# Patient Record
Sex: Male | Born: 1990 | Race: White | Hispanic: No | Marital: Single | Smoking: Former smoker
Health system: Southern US, Community
[De-identification: ages and names within clinical notes are randomized; demographics above are authoritative.]

---
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
## 2012-01-10 HISTORY — PX: FRACTURE SURGERY: SHX138

## 2012-04-07 ENCOUNTER — Ambulatory Visit: Payer: Self-pay

## 2012-04-07 LAB — PROTIME-INR: Prothrombin Time: 23.4 secs — ABNORMAL HIGH (ref 11.5–14.7)

## 2012-04-09 LAB — PROTIME-INR: INR: 2

## 2012-04-11 ENCOUNTER — Ambulatory Visit: Payer: Self-pay

## 2012-04-11 LAB — PROTIME-INR: Prothrombin Time: 21.7 secs — ABNORMAL HIGH (ref 11.5–14.7)

## 2012-04-13 LAB — PROTIME-INR: INR: 2

## 2014-02-09 HISTORY — PX: FRACTURE SURGERY: SHX138

## 2014-04-21 ENCOUNTER — Encounter: Payer: Self-pay | Admitting: Unknown Physician Specialty

## 2014-05-12 ENCOUNTER — Encounter: Payer: Self-pay | Admitting: Unknown Physician Specialty

## 2014-09-15 ENCOUNTER — Ambulatory Visit: Payer: 59 | Attending: Unknown Physician Specialty | Admitting: Occupational Therapy

## 2014-09-15 ENCOUNTER — Encounter: Payer: Self-pay | Admitting: Occupational Therapy

## 2014-09-15 DIAGNOSIS — Z4789 Encounter for other orthopedic aftercare: Secondary | ICD-10-CM

## 2014-09-15 DIAGNOSIS — Z5189 Encounter for other specified aftercare: Secondary | ICD-10-CM | POA: Diagnosis not present

## 2014-09-15 NOTE — Patient Instructions (Signed)
Educated on donning and doffing as well as precautions - demo and verbalize understanding

## 2014-09-15 NOTE — Therapy (Signed)
[facility] [facility] [address], [place], [address] Phone: [phone]   Fax:  [phone]  Occupational Therapy Treatment  Patient Details  Name: Tanner French MRN: [id number] Date of Birth: 04/17/1990 Referring Provider:  Erin Sons, MD  Encounter Date: 09/15/2014      OT End of Session - 09/15/14 1754    Visit Number 1   Number of Visits 2   Date for OT Re-Evaluation 09/22/14   OT Start Time 1203   OT Stop Time 1226   OT Time Calculation (min) 23 min   Activity Tolerance Patient tolerated treatment well   Behavior During Therapy [facility] for tasks assessed/performed      No past medical history on file.  Past Surgical History  Procedure Laterality Date  . Fracture surgery Left 10/13    L Ulna  . Fracture surgery Left 11/15    L ulna    There were no vitals filed for this visit.  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 No/denies            [facility] OT Assessment - 09/15/14 0001    Assessment   Diagnosis Ulnar fracture   Onset Date 03/04/14   Prior Therapy Had splint earlier this year - but forgot it in car and melted    Precautions   Precaution Comments Protected forearm splint when out and about    AROM   Overall AROM Comments Wrist and hand AROM appear WFL                   OT Treatments/Exercises (OP) - 09/15/14 0001    Splinting   Splinting  Fabricated circular polycast fabric forearm splint to protect ulna - pt and mom educated on precautions , wearing and  donning /doffing                OT Education - 09/15/14 1754    Education provided Yes   Education Details splint wearing   Person(s) Educated Patient   Methods Explanation;Demonstration;Verbal cues   Comprehension Verbalized understanding;Returned demonstration;Verbal cues required          OT Short Term Goals - 09/15/14 1758    OT SHORT TERM GOAL #1   Title Pt and mom to be independent in donning and doffing of forearm splint    Time 2   Period Weeks   Status Achieved           OT Long Term Goals - 09/15/14 1758    OT LONG TERM GOAL #1   Title Pt and mom independent in wearing of forearm splint without any skin issues    Time 2   Period Weeks   Status New               Plan - 09/15/14 1755  Clinical Impression Statement Pt present about 7 months out from 2nd fracture to L ulna - was seen earlier this year for custome circular forearm splint for protection but forgot it in car and melted - fabricated this date new one from polycast - pt and mom show understanding for wearing of if and precautions - pt tto phone in any problems otherwise will  discharge him in week or 2   Pt will benefit from skilled therapeutic intervention in order to improve on the following deficits (Retired) Decreased knowledge of precautions;Decreased safety awareness;Other (comment)   Rehab Potential Fair   OT Frequency Biweekly   OT Duration 2 weeks   OT Treatment/Interventions Self-care/ADL training;Splinting   Consulted and Agree with Plan of Care Patient;Family member/caregiver        Problem List There are no active problems to display for this patient.   Deontez Klinke OTR/L, CLT 09/15/2014, 6:02 PM  [facility] [address] [place], [place], [address] Phone: [phone]   Fax:   [phone]

## 2015-10-01 ENCOUNTER — Ambulatory Visit: Payer: Managed Care, Other (non HMO) | Attending: Unknown Physician Specialty | Admitting: Occupational Therapy

## 2015-10-01 ENCOUNTER — Encounter: Payer: Self-pay | Admitting: Occupational Therapy

## 2015-10-01 DIAGNOSIS — M6281 Muscle weakness (generalized): Secondary | ICD-10-CM | POA: Insufficient documentation

## 2015-10-01 DIAGNOSIS — M25632 Stiffness of left wrist, not elsewhere classified: Secondary | ICD-10-CM | POA: Diagnosis present

## 2015-10-01 NOTE — Patient Instructions (Signed)
Pt to do heating pad with forearm in supination stretch 10 min  PROM for supination  Borrowed supination wheel for supination - 20 reps - elbow height table  2 lbs weight for sup - against wall to decrease leaning with body   Prayer stretch for wrist extention - 10reps  2 lbs weight for wrist extention - focus on midline  PUtty green for grip , pulling with all digits  10-12 reps

## 2015-10-01 NOTE — Therapy (Signed)
[place] [facility] [address] [place], [place], [address] Phone: [phone]   Fax:  [phone]  Occupational Therapy Treatment  Patient Details  Name: Tanner French Hampe MRN: [id number] Date of Birth: 12-03-1990 No Data Recorded  Encounter Date: 10/01/2015      OT End of Session - 10/01/15 1952    Visit Number 1   Number of Visits 6   Date for OT Re-Evaluation 11/12/15   OT Start Time 1435   OT Stop Time 1533   OT Time Calculation (min) 58 min   Activity Tolerance Patient tolerated treatment well;Treatment limited secondary to medical complications (Comment);Other (comment)   Behavior During Therapy [facility] for tasks assessed/performed;Impulsive      No past medical history on file.  Past Surgical History  Procedure Laterality Date  . Fracture surgery Left 10/13    L Ulna  . Fracture surgery Left 11/15    L ulna    There were no vitals filed for this visit.      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 No/denies            [facility] - 10/01/15 0001    Assessment   Diagnosis Ulnar fracture   Onset Date 03/04/14   Prior Therapy Had splnt made for shaft x 2    Home  Environment   Lives With Alone   Prior Function   Vocation Student   Leisure likes to play games, visit friends    Coordination   Other Pt was 50% slower with [facility] tasks and dexterity   AROM   Right Forearm Supination 90 Degrees   Left Forearm Supination 25 Degrees   Right Wrist Extension 68 Degrees   Right Wrist Flexion 92 Degrees   Left Wrist Extension 55 Degrees    Left Wrist Flexion 74 Degrees   Strength   Right Hand Grip (lbs) 102   Right Hand Lateral Pinch 21 lbs   Right Hand 3 Point Pinch 30 lbs   Left Hand Grip (lbs) 38   Left Hand Lateral Pinch 17 lbs   Left Hand 3 Point Pinch 13 lbs        see pt instruction                   OT Education - 10/01/15 1952    Education provided Yes   Education Details HEP   Person(s) Educated Patient;Parent(s)   Methods Demonstration;Tactile cues;Verbal cues;Handout;Explanation   Comprehension Verbalized understanding;Returned demonstration;Verbal cues required;Tactile cues required          OT Short Term Goals - 10/01/15 1959    OT SHORT TERM GOAL #1   Title Pt to show increase grip in L hand by 10 lbs at least to report more even pressure during massage    Baseline grip L 38, R 102 lbs    Time 3   Period Weeks   Status New   OT SHORT TERM GOAL #2   Title L extention improve with 5  degrees to reach in back pocket with more ease    Baseline Ext L 55, R 68 degrees   Time 3   Period Weeks   Status New           OT Long Term Goals - 10/01/15 2002    OT LONG TERM GOAL #1   Title Supination improve on L with 10-15 degrees to report more ease turn palm up in drive thru or if somebody hand him someting at school    Baseline sup L 25, R 90    Time 6   Period Weeks   Status New   OT LONG TERM GOAL #2   Title Pt to be ind in homeprogram to increaes ROM and strength in L UE - including shoulder -    Baseline Shoulder flexion and externtal rotation tight but functional                Plan - 10/01/15 1954    Clinical Impression Statement Pt present to  OT eval to increase  ROM , strength in L UE to increase functional use - 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, massage , turnning palm up in drive thru - reaching in back pocket - pt do have delayed healing of ulnar on L - pt on 5 lbs limit - pt in impulsive and  report he does pushups  - pt mom present and discuss that we maybe going to be limited in what we can gain back after so long -  did ed on HEP    Rehab Potential Fair   OT Frequency 1x / week   OT Duration 6 weeks   OT Treatment/Interventions Self-care/ADL training;Patient/family education;Therapeutic exercise;Parrafin;Passive range of motion   Plan assess progress of HEP    OT Home Exercise Plan see pt instruction    Consulted and Agree with Plan of Care Patient;Family member/caregiver      Patient will benefit from skilled therapeutic intervention in order to improve the following deficits and impairments:  Decreased range of motion, Impaired flexibility, Impaired UE functional use, Decreased strength  Visit Diagnosis: Stiffness of left wrist, not elsewhere classified - Plan: Ot plan of care cert/re-cert  Muscle weakness (generalized) - Plan: Ot plan of care cert/re-cert    Problem List There are no active problems to display for this patient.   [name], Adien Kimmel OTR/CLT 10/01/2015, 8:08 PM  [place] [facility] [address] [place], [place], [address] Phone: [phone]   Fax:  [phone]  Name: Tanner French Tanner French MRN: [id number] Date of Birth: 1990-12-07

## 2015-10-15 ENCOUNTER — Ambulatory Visit: Payer: Managed Care, Other (non HMO) | Attending: Unknown Physician Specialty | Admitting: Occupational Therapy

## 2015-10-15 DIAGNOSIS — M25632 Stiffness of left wrist, not elsewhere classified: Secondary | ICD-10-CM | POA: Diagnosis not present

## 2015-10-15 DIAGNOSIS — M6281 Muscle weakness (generalized): Secondary | ICD-10-CM | POA: Diagnosis present

## 2015-10-15 DIAGNOSIS — Z5189 Encounter for other specified aftercare: Secondary | ICD-10-CM | POA: Insufficient documentation

## 2015-10-15 NOTE — Patient Instructions (Signed)
Pt to do heating pad with forearm in supination stretch- elbow to side ( REINFORCE) 10 min  PROM for supination  Borrowed supination wheel for supination - 20 reps - elbow height table / sitting on cuttingboard    PUtty upgrade to dark blue for grip , pulling with all digits 10-12 reps

## 2015-10-15 NOTE — Therapy (Signed)
[place] [facility] [address] [place], [place], [address] Phone: [phone]   Fax:  [phone]  Occupational Therapy Treatment  Patient Details  Name: Tanner CocoBrandon C Irizarry MRN: [id number] Date of Birth: 1990-05-22 No Data Recorded  Encounter Date: 10/15/2015      OT End of Session - 10/15/15 1751    Visit Number 2   Number of Visits 6   Date for OT Re-Evaluation 11/12/15   OT Start Time 1558   OT Stop Time 1649   OT Time Calculation (min) 51 min   Activity Tolerance Patient tolerated treatment well;Treatment limited secondary to medical complications (Comment);Other (comment)   Behavior During Therapy [facility] for tasks assessed/performed;Impulsive      No past medical history on file.  Past Surgical History  Procedure Laterality Date  . Fracture surgery Left 10/13    L Ulna  . Fracture surgery Left 11/15    L ulna    There were no vitals filed for this visit.      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 No/denies    Measurements takend and progress and HEP discuss with pt and mom   Paraffin to L hand and wrist with hand and forearm in supination with  heating pad with forearm in supination stretch 10 min  PROM for supination   supination wheel for supination - 20 reps - elbow height table - with AAROM  Discuss position of elbow very importance and where to do at home   Prayer stretch for wrist extention - 10reps  2 lbs weight for wrist extention - focus on midline  PUtty upgrade after  green for grip to dark blue - 2 x 15 reps Also review  pulling with all digits- pt was doing only 3 point pull  10-12 reps                           OT Education - 10/15/15 1750    Education provided Yes   Education Details HEP   Person(s) Educated Patient;Parent(s)   Methods Explanation;Demonstration;Tactile cues;Verbal cues;Handout   Comprehension Verbal cues required;Returned demonstration;Verbalized understanding          OT Short Term Goals - 10/01/15 1959    OT SHORT TERM GOAL #1   Title Pt to show increase grip in L hand by 10 lbs at least to report more even pressure during massage    Baseline grip L 38, R 102 lbs    Time 3   Period Weeks   Status New   OT SHORT TERM GOAL #2   Title L extention improve with 5 degrees to reach in back pocket with more ease    Baseline Ext L 55, R 68 degrees   Time 3   Period Weeks   Status New           OT Long Term Goals - 10/01/15 2002    OT LONG TERM GOAL #  1   Title Supination improve on L with 10-15 degrees to report more ease turn palm up in drive thru or if somebody hand him someting at school    Baseline sup L 25, R 90    Time 6   Period Weeks   Status New   OT LONG TERM GOAL #2   Title Pt to be ind in homeprogram to increaes ROM and strength in L UE - including shoulder -    Baseline Shoulder flexion and externtal rotation tight but functional                Plan - 10/15/15 1752    Clinical Impression Statement Pt admit did not do all the exercises - and also when did the supinaton stretch did it with elbow straight and shoulder height - explain again for pt that is out of shoulder and not forearm - pt also did not do putty gripping , more pulliing but not all dits - did reviewed again HEP with pt and mom - pt to see DR kernocle next week - will return in 2 wks  - did upgrade to harder putty    Rehab Potential Fair   OT Frequency Biweekly   OT Duration 4 weeks   OT Treatment/Interventions Self-care/ADL training;Patient/family  education;Therapeutic exercise;Parrafin;Passive range of motion   Plan assess progress with HEP   OT Home Exercise Plan see pt instruction    Consulted and Agree with Plan of Care Patient;Family member/caregiver      Patient will benefit from skilled therapeutic intervention in order to improve the following deficits and impairments:  Decreased range of motion, Impaired flexibility, Impaired UE functional use, Decreased strength  Visit Diagnosis: Stiffness of left wrist, not elsewhere classified  Muscle weakness (generalized)    Problem List There are no active problems to display for this patient.   [name], Keneisha Heckart OTR/L,CLT  10/15/2015, 5:54 PM  [place] [facility] [address] [place], [place], [address] Phone: [phone]   Fax:  [phone]  Name: Tanner CocoBrandon C French MRN: [id number] Date of Birth: 1991/04/06

## 2015-10-29 ENCOUNTER — Ambulatory Visit: Payer: Managed Care, Other (non HMO) | Admitting: Occupational Therapy

## 2015-10-29 DIAGNOSIS — M25632 Stiffness of left wrist, not elsewhere classified: Secondary | ICD-10-CM | POA: Diagnosis not present

## 2015-10-29 DIAGNOSIS — Z4789 Encounter for other orthopedic aftercare: Secondary | ICD-10-CM

## 2015-10-29 DIAGNOSIS — M6281 Muscle weakness (generalized): Secondary | ICD-10-CM

## 2015-10-29 NOTE — Patient Instructions (Signed)
Pt to cont with same HEP - awaiting response back from [place] surgeon in [place] - about weight bearing and strenghtening as well as what to expect with xray  And  Distal ulnar instability Mom send email

## 2015-10-29 NOTE — Therapy (Signed)
[facility] [place] [facility] [address] [place], [place], [address] Phone: [phone]   Fax:  [phone]  Occupational Therapy Treatment  Patient Details  Name: Tanner CocoBrandon C Kiely MRN: [id number] Date of Birth: 08/02/1990 No Data Recorded  Encounter Date: 10/29/2015      OT End of Session - 10/29/15 2000    Visit Number 3   Number of Visits 6   Date for OT Re-Evaluation 11/12/15   OT Start Time 1300   OT Stop Time 1346   OT Time Calculation (min) 46 min   Activity Tolerance Patient tolerated treatment well;Treatment limited secondary to medical complications (Comment);Other (comment)   Behavior During Therapy [facility] for tasks assessed/performed;Impulsive      No past medical history on file.  Past Surgical History  Procedure Laterality Date  . Fracture surgery Left 10/13    L Ulna  . Fracture surgery Left 11/15    L ulna    There were no vitals filed for this visit.      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 No/denies            [facility] Assessment - 10/29/15 0001    Strength   Left Hand Grip (lbs) 40   Left Hand Lateral Pinch 17 lbs       Supination same  Pain with supination stretch with elbow to side at home  This date with pressure on ulna head during supination - clicking   Assess xray that mom had  DRUJ appear head of ulna instability  ?? If supination and grip will improve   2 shaft fractures in past - infection - atrophy  In length discuss with pt and mom - about  strenghteing and working out in gym - pt want to start with 25 lbs and more for lifting  But pt only was allow more than 7 lbs by MD week or 2 ago  Mom has email in to surgeon in [place] - for opinion - will await results                    OT Education - 10/29/15 1959    Education provided Yes   Education Details Finding of assessment and looking at xray - and what pt report - discuss plan with pt and mom    Person(s) Educated Patient   Methods Explanation;Demonstration;Tactile cues;Verbal cues   Comprehension Verbal cues required;Returned demonstration;Verbalized understanding          OT Short Term Goals - 10/01/15 1959    OT SHORT TERM GOAL #1   Title Pt to show increase grip in L hand by 10 lbs at least to report more even pressure during massage    Baseline grip L 38, R 102 lbs    Time 3   Period Weeks   Status New   OT SHORT TERM GOAL #2   Title L  extention improve with 5 degrees to reach in back pocket with more ease    Baseline Ext L 55, R 68 degrees   Time 3   Period Weeks   Status New           OT Long Term Goals - 10/01/15 2002    OT LONG TERM GOAL #1   Title Supination improve on L with 10-15 degrees to report more ease turn palm up in drive thru or if somebody hand him someting at school    Baseline sup L 25, R 90    Time 6   Period Weeks   Status New   OT LONG TERM GOAL #2   Title Pt to be ind in homeprogram to increaes ROM and strength in L UE - including shoulder -    Baseline Shoulder flexion and externtal rotation tight but functional                Plan - 10/29/15 2001    Clinical Impression Statement Pt did not show any progress in supination since eval , grip improved with 2 lbs - pt compensate with ext rotation of shoulder for supination stretch - pt report this date if doing correctly it hurts about 4/10 - and some clicking -  when mom  showed xray - it appear that  there is displace ment or instability  at DRUJ -   question  if supiantion  and grip still will improve with history of that and infection in past , 2 shaft fractures nad athropy- mom Chiropractor in [place] - attempted to call Dr Gavin Potters this am  - pt no in office     Rehab Potential Fair   OT Treatment/Interventions Self-care/ADL training;Patient/family education;Therapeutic exercise;Parrafin;Passive range of motion   Plan await  response back from ortho    OT Home Exercise Plan see pt instruction    Consulted and Agree with Plan of Care Patient;Family member/caregiver      Patient will benefit from skilled therapeutic intervention in order to improve the following deficits and impairments:  Decreased range of motion, Impaired flexibility, Impaired UE functional use, Decreased strength  Visit Diagnosis: Stiffness of left wrist, not elsewhere classified  Muscle weakness (generalized)  Encounter for training in use of orthotic device    Problem List There are no active problems to display for this patient.   Oletta Cohn OTR/L,CLT  10/29/2015, 8:05 PM  [place] [facility] PHYSICAL AND SPORTS MEDICINE [address], [place], [address] Phone: [phone]   Fax:  [phone]  Name: BYARD CARRANZA MRN: [id number] Date of Birth: June 26, 1990

## 2016-11-18 ENCOUNTER — Other Ambulatory Visit: Payer: Self-pay | Admitting: Specialist

## 2016-11-18 DIAGNOSIS — M8660 Other chronic osteomyelitis, unspecified site: Secondary | ICD-10-CM

## 2016-11-19 ENCOUNTER — Ambulatory Visit: Payer: Managed Care, Other (non HMO)

## 2016-12-01 ENCOUNTER — Ambulatory Visit
Admission: RE | Admit: 2016-12-01 | Discharge: 2016-12-01 | Disposition: A | Discharge status: Home / Self Care | Payer: Managed Care, Other (non HMO) | Source: Ambulatory Visit | Attending: Specialist | Admitting: Specialist

## 2016-12-01 ENCOUNTER — Ambulatory Visit
Admission: RE | Admit: 2016-12-01 | Discharge: 2016-12-01 | Disposition: A | Discharge status: Home / Self Care | Payer: Managed Care, Other (non HMO) | Source: Ambulatory Visit | Attending: *Deleted | Admitting: *Deleted

## 2016-12-01 ENCOUNTER — Other Ambulatory Visit: Payer: Self-pay | Admitting: Specialist

## 2016-12-01 DIAGNOSIS — M8660 Other chronic osteomyelitis, unspecified site: Secondary | ICD-10-CM | POA: Diagnosis not present

## 2016-12-01 DIAGNOSIS — Z8781 Personal history of (healed) traumatic fracture: Secondary | ICD-10-CM | POA: Insufficient documentation

## 2017-01-05 ENCOUNTER — Other Ambulatory Visit: Payer: Self-pay | Admitting: Specialist

## 2017-01-05 DIAGNOSIS — M79631 Pain in right forearm: Secondary | ICD-10-CM

## 2017-01-05 DIAGNOSIS — M79602 Pain in left arm: Secondary | ICD-10-CM

## 2017-01-05 DIAGNOSIS — M79601 Pain in right arm: Secondary | ICD-10-CM

## 2017-01-05 DIAGNOSIS — M79632 Pain in left forearm: Secondary | ICD-10-CM

## 2017-01-06 ENCOUNTER — Other Ambulatory Visit: Payer: Self-pay | Admitting: Specialist

## 2017-01-06 DIAGNOSIS — M86639 Other chronic osteomyelitis, unspecified radius and ulna: Secondary | ICD-10-CM

## 2017-01-06 DIAGNOSIS — M86632 Other chronic osteomyelitis, left radius and ulna: Secondary | ICD-10-CM

## 2017-01-12 ENCOUNTER — Ambulatory Visit
Admission: RE | Admit: 2017-01-12 | Discharge: 2017-01-12 | Disposition: A | Discharge status: Home / Self Care | Payer: Managed Care, Other (non HMO) | Source: Ambulatory Visit | Attending: Specialist | Admitting: Specialist

## 2017-01-12 DIAGNOSIS — M86639 Other chronic osteomyelitis, unspecified radius and ulna: Secondary | ICD-10-CM | POA: Insufficient documentation

## 2017-01-12 DIAGNOSIS — Z01818 Encounter for other preprocedural examination: Secondary | ICD-10-CM | POA: Insufficient documentation

## 2017-01-12 DIAGNOSIS — M86632 Other chronic osteomyelitis, left radius and ulna: Secondary | ICD-10-CM | POA: Insufficient documentation

## 2017-01-12 DIAGNOSIS — M2480 Other specific joint derangements of unspecified joint, not elsewhere classified: Secondary | ICD-10-CM | POA: Diagnosis not present

## 2017-03-24 ENCOUNTER — Ambulatory Visit: Payer: Managed Care, Other (non HMO) | Attending: Specialist | Admitting: Occupational Therapy

## 2017-03-24 DIAGNOSIS — M6281 Muscle weakness (generalized): Secondary | ICD-10-CM | POA: Insufficient documentation

## 2017-03-24 DIAGNOSIS — Z4789 Encounter for other orthopedic aftercare: Secondary | ICD-10-CM | POA: Diagnosis present

## 2017-03-24 DIAGNOSIS — L905 Scar conditions and fibrosis of skin: Secondary | ICD-10-CM | POA: Insufficient documentation

## 2017-03-24 DIAGNOSIS — M25632 Stiffness of left wrist, not elsewhere classified: Secondary | ICD-10-CM | POA: Diagnosis not present
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
## 2017-03-24 NOTE — Patient Instructions (Signed)
Fabricated forearm fracture splint to wear all the time except for ADL's  Sling to wear outside of house - send email to surgeon to check if still needed if he has the splint on   Will start AROM next week - did sent protocol to surgeon

## 2017-03-28 ENCOUNTER — Ambulatory Visit
Admission: RE | Admit: 2017-03-28 | Discharge: 2017-03-28 | Disposition: A | Discharge status: Home / Self Care | Payer: Managed Care, Other (non HMO) | Source: Ambulatory Visit | Attending: Specialist | Admitting: Specialist

## 2017-03-28 ENCOUNTER — Other Ambulatory Visit: Payer: Self-pay | Admitting: Specialist

## 2017-03-28 DIAGNOSIS — S52332S Displaced oblique fracture of shaft of left radius, sequela: Secondary | ICD-10-CM

## 2017-03-28 DIAGNOSIS — X58XXXS Exposure to other specified factors, sequela: Secondary | ICD-10-CM | POA: Diagnosis not present

## 2017-03-30 ENCOUNTER — Ambulatory Visit: Payer: Managed Care, Other (non HMO) | Admitting: Occupational Therapy

## 2017-03-30 DIAGNOSIS — L905 Scar conditions and fibrosis of skin: Secondary | ICD-10-CM

## 2017-03-30 DIAGNOSIS — M25632 Stiffness of left wrist, not elsewhere classified: Secondary | ICD-10-CM

## 2017-03-30 DIAGNOSIS — Z4789 Encounter for other orthopedic aftercare: Secondary | ICD-10-CM

## 2017-03-30 DIAGNOSIS — M6281 Muscle weakness (generalized): Secondary | ICD-10-CM

## 2017-03-30 NOTE — Therapy (Signed)
[facility] [address] [place], [place], [address] Phone: [phone]   Fax:  [phone]  Occupational Therapy Treatment  Patient Details  Name: Tanner CocoBrandon C Stumpp MRN: [id number] Date of Birth: March 03, 1991 No Data Recorded  Encounter Date: 03/30/2017  OT End of Session - 03/30/17 1720    Visit Number  2    Number of Visits  20    Date for OT Re-Evaluation  06/02/17    OT Start Time  1618    OT Stop Time  1717    OT Time Calculation (min)  59 min    Activity Tolerance  Patient tolerated treatment well    Behavior During Therapy  [facility] for tasks assessed/performed       No past medical history on file.  The histories are not reviewed yet. Please review them in the "History" navigator section and refresh this SmartLink.  There were no vitals filed for this visit.  Subjective Assessment - 03/30/17 1716    Subjective   Had xray and surgeon looked at it - looks all good - can do AROM andAAROM to wrist and foream -and PROM /AROM to hand -     Patient Stated Goals  I want to get my motion back in my forearm and work out with you in gym - do weights     Currently in Pain?  No/denies         [facility] Assessment - 03/30/17 0001      AROM   Left Forearm Pronation  65 Degrees    Left Forearm Supination  85 Degrees    Left Wrist Extension  55 Degrees    Left Wrist Flexion  80 Degrees               OT Treatments/Exercises (OP) - 03/30/17 0001      Moist Heat Therapy   Number Minutes Moist Heat  10 Minutes    Moist Heat Location  -- forearm in pronation - prior to AROM        Assess AROM of wrist , forearm and digits   see flowsheet   Per MD order  Provided and review HEP - mom video and hand out provided :   After heat  Started AROM and AAROM for wrist in all planes  over edge of table wrist flexion, ext, RD, UD - slight pull but no pain  No weight thru hand 10 reps   2 x day  Sup and  pronation wheel for AAROM - elbow to side - 10 reps   5 x day  Heat prior if can  Elbow flexion and extention - 3 position   10 reps   2 x day    Adjusted splint - cut back at wrist - to allow more wrist ROM and decrease rubbing - and provided new straps- old ones was splitting        OT Education - 03/30/17 1719    Education provided  Yes    Education Details  HEP     Person(s) Educated  Patient;Parent(s)    Methods  Explanation;Demonstration;Tactile cues;Verbal cues;Handout    Comprehension  Verbalized understanding;Returned demonstration;Verbal cues required       OT Short Term Goals - 03/24/17 1946      OT SHORT TERM GOAL #1   Title  Pt to be ind in wearing of splint for protection of healing of surgery site     Baseline  fabricated  this date forearm fracture splint     Time  3    Period  Weeks    Status  New    Target Date  04/14/17      OT SHORT TERM GOAL #2   Title  Pt to be ind in HEP for AROM and AAROM for L wrist and forearm to return to AROM WNL  and scar management     Baseline  Sup and pronation 70 , see flowsheet , still stitches in     Time  4    Period  Weeks    Status  New    Target Date  04/21/17        OT Long Term Goals - 03/24/17 1949      OT LONG TERM GOAL #1   Title  Strength in L hand and arm improve for pt to carry 10 lbs , 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     Baseline  10 days s/p    Time  8    Period  Weeks    Status  New    Target Date  05/19/17      OT LONG TERM GOAL #2   Title  Pt pain and ROM improve for pt to return to working out on gym equipment     Baseline  s/p 10 days ago     Time  10    Period  Weeks    Status  New    Target Date  06/02/17            Plan - 03/30/17 1720    Clinical Impression Statement  Pt had xray on 18th - good healing per mom and surgeon - mom had email form surgeon - AAROM and AROM to forearm and wrist - and PROM /AROM to digist - cont foream splint - pt and mom ed on  HEP - progress already from last week     Occupational performance deficits (Please refer to evaluation for details):  ADL's;IADL's;Work;Play;Leisure    Rehab Potential  Fair    Current Impairments/barriers affecting progress:  TBI - decrease judgement , following precautions and protocol     OT Frequency  1x / week    OT Duration  6 weeks    OT Treatment/Interventions  Self-care/ADL training;Fluidtherapy;Splinting;Therapeutic exercise;Scar mobilization;Passive range of motion;Manual Therapy    Plan  assesss progress and cont AROM and AAROM - check on stitches     Clinical Decision Making  Limited treatment options, no task modification necessary    OT Home Exercise Plan  see pt instruction     Consulted and Agree with Plan of Care  Patient       Patient will benefit from skilled therapeutic intervention in order to improve the following deficits and impairments:  Impaired flexibility, Pain, Decreased scar mobility, Decreased range of motion, Decreased strength, Impaired UE functional use  Visit Diagnosis: Stiffness of left wrist, not elsewhere classified  Muscle weakness (generalized)  Scar condition and fibrosis of skin  Encounter for training in use of orthotic device    Problem List There are no active problems to display for this patient.   [name], Dorea Duff OTR/L,CLT  03/30/2017, 5:22 PM  [facility] [address] [place], [place], [address] Phone: [phone]   Fax:  [phone]  Name: Tanner CocoBrandon C Edelman MRN: [id number] Date of Birth: 09-20-1990

## 2017-03-30 NOTE — Patient Instructions (Signed)
Per MD order ed mom and pt on HEP  After heat  Started AROM and AAROM for wrist in all planes  over edge of table wrist flexion, ext, RD, UD - slight pull but no pain  10 reps   2 x day  Sup and pronation wheel for AAROM - elbow to side - 10 reps   5 x day  Heat prior if can  Elbow flexion and extention - 3 position   10 reps   2 x day
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
## 2018-09-19 ENCOUNTER — Encounter: Payer: Self-pay | Admitting: Emergency Medicine

## 2018-09-19 ENCOUNTER — Ambulatory Visit
Admission: EM | Admit: 2018-09-19 | Discharge: 2018-09-19 | Disposition: A | Discharge status: Home / Self Care | Payer: Self-pay | Attending: Urgent Care | Admitting: Urgent Care

## 2018-09-19 ENCOUNTER — Other Ambulatory Visit: Payer: Self-pay

## 2018-09-19 DIAGNOSIS — K2900 Acute gastritis without bleeding: Secondary | ICD-10-CM

## 2018-09-19 MED ORDER — SUCRALFATE 1 G PO TABS: 1.0000 g | ORAL_TABLET | Freq: Three times a day (TID) | ORAL | 0 refills | Status: DC

## 2018-09-19 NOTE — ED Triage Notes (Signed)
Patient c/o upper left sided abdominal pain that lasted about 52min this morning.  Patient denies V/D.  Patient denies fevers.

## 2018-09-19 NOTE — ED Provider Notes (Signed)
[address], [address] [place], [place] [address] [phone]   Name: Tanner CocoBrandon C Flammer DOB: 1990/05/27 MRN: [id number] CSN: [id number] PCP: Rolm GalaGrandis, Heidi, MD  Arrival date and time:  09/19/18 1032  Chief Complaint:  Abdominal Pain  NOTE: Prior to seeing the patient today, I have reviewed the triage nursing documentation and vital signs. Clinical staff has updated patient's PMH/PSHx, current medication list, and drug allergies/intolerances to ensure comprehensive history available to assist in medical decision making.   History:   HPI: Tanner French is a 28 y.o. male who presents today with complaints of pain in the LEFT upper quadrant of his abdomen earlier today. Pain was sharp in nature, and reported to have lasted approximately 30 - 45 minutes. Pain was essentially localized; did not radiate into chest, shoulder, back, neck, or scapular areas. He denies any associated shortness of breath. Patient endorses some minor associated nausea. He denies vomiting, diarrhea, or fevers. Patient does not routinely use NSAID medications. He does, however consume ETOH on a daily basis.   At the time that the patient was seen in clinic, he notes that his symptoms have fully resolved. Patient self administered an "effervescent sodium bicarbonate tablet" just prior to arrival. Patient states, "my pain went away while I was sitting in the waiting room, but I had already paid my co-pay, so I figured I would just be seen".   History reviewed. No pertinent past medical history.  Past Surgical History:  Procedure Laterality Date  . FRACTURE SURGERY Left 10/13   L Ulna  . FRACTURE SURGERY Left 11/15   L ulna    Family History  Problem Relation Age of Onset  . Healthy Mother   . Healthy Father     Social History   Socioeconomic History  . Marital status: Single    Spouse name: Not on file  . Number of children: Not on file  . Years of education: Not on file  . Highest education  level: Not on file  Occupational History  . Not on file  Social Needs  . Financial resource strain: Not on file  . Food insecurity    Worry: Not on file    Inability: Not on file  . Transportation needs    Medical: Not on file    Non-medical: Not on file  Tobacco Use  . Smoking status: Former Games developermoker  . Smokeless tobacco: Never Used  Substance and Sexual Activity  . Alcohol use: Yes  . Drug use: Yes    Types: Marijuana  . Sexual activity: Not on file  Lifestyle  . Physical activity    Days per week: Not on file    Minutes per session: Not on file  . Stress: Not on file  Relationships  . Social Musicianconnections    Talks on phone: Not on file    Gets together: Not on file    Attends religious service: Not on file    Active member of club or organization: Not on file    Attends meetings of clubs or organizations: Not on file    Relationship status: Not on file  . Intimate partner violence    Fear of current or ex partner: Not on file    Emotionally abused: Not on file    Physically abused: Not on file    Forced sexual activity: Not on file  Other Topics Concern  . Not on file  Social History Narrative  . Not on file    There are no active  problems to display for this patient.   Home Medications:    No outpatient medications have been marked as taking for the 09/19/18 encounter [facility](Hospital Encounter).    Allergies:   Patient has no known allergies.  Review of Systems (ROS): Review of Systems  Constitutional: Negative for chills and fever.  Respiratory: Negative for cough and shortness of breath.   Cardiovascular: Negative for chest pain and palpitations.  Gastrointestinal: Positive for abdominal pain and nausea. Negative for constipation, diarrhea and vomiting.  Musculoskeletal: Negative for back pain and neck pain.  Skin: Negative.   Neurological: Negative for dizziness, weakness, numbness and headaches.  Hematological: Negative for adenopathy.     Physical Exam:   Triage Vital Signs ED Triage Vitals  Enc Vitals Group     BP 09/19/18 1051 134/87     Pulse Rate 09/19/18 1051 61     Resp 09/19/18 1051 16     Temp 09/19/18 1051 98.6 F (37 C)     Temp Source 09/19/18 1051 Oral     SpO2 09/19/18 1051 99 %     Weight 09/19/18 1048 150 lb (68 kg)     Height 09/19/18 1048 5\' 11"  (1.803 m)     Head Circumference --      Peak Flow --      Pain Score 09/19/18 1048 3     Pain Loc --      Pain Edu? --      Excl. in GC? --     Physical Exam  Constitutional: He is oriented to person, place, and time and well-developed, well-nourished, and in no distress.  HENT:  Head: Normocephalic and atraumatic.  Mouth/Throat: Oropharynx is clear and moist and mucous membranes are normal.  Eyes: Pupils are equal, round, and reactive to light. EOM are normal.  Neck: Normal range of motion. Neck supple.  Cardiovascular: Normal rate, regular rhythm, normal heart sounds and intact distal pulses. Exam reveals no gallop and no friction rub.  No murmur heard. Pulmonary/Chest: Effort normal and breath sounds normal. No respiratory distress. He has no wheezes. He has no rales.  Abdominal: Normal appearance and bowel sounds are normal. He exhibits no distension, no ascites, no pulsatile midline mass and no mass. There is no hepatosplenomegaly. There is no abdominal tenderness. There is no CVA tenderness.  Lymphadenopathy:    He has no cervical adenopathy.  Neurological: He is alert and oriented to person, place, and time.  Skin: Skin is warm and dry. No rash noted. No erythema.  Psychiatric: Mood, affect and judgment normal.  Nursing note and vitals reviewed.    Urgent Care Treatments / Results:   LABS: PLEASE NOTE: all labs that were ordered this encounter are listed, however only abnormal results are displayed. Labs Reviewed - No data to display  EKG: -None  RADIOLOGY: No results found.  PROCEDURES: Procedures  MEDICATIONS RECEIVED THIS VISIT: Medications  - No data to display  PERTINENT CLINICAL COURSE NOTES/UPDATES:   Initial Impression / Assessment and Plan / Urgent Care Course:    Tanner CocoBrandon C Gearing is a 28 y.o. male who [facility] with complaints of Abdominal Pain  Pertinent labs & imaging results that were available during my care of the patient were personally reviewed by me and considered in my medical decision making (see lab/imaging section of note for values and interpretations).  Patient overall well appearing and in no acute distress today in clinic. Exam is benign. Symptoms had resolved prior to patient  being seen. Based on his reported symptoms, and the fact that they resolved with bicarbonate, I suspect gastritis. Given his daily ETOH consumption, I feel as if his ETOH is at least contributory. He denies bleeding; no hematochezia or hematemesis. Will provide a short course of sucralfate to help with his symptoms. Patient to follow up with PCP for recurrence. May benefit from GI evaluation for consideration of EGD to evaluate the gastric mucosa.   I have reviewed the follow up and strict return precautions for any new or worsening symptoms. Patient is aware of symptoms that would be deemed urgent/emergent, and would thus require further evaluation either here or in the emergency department. At the time of discharge, he verbalized understanding and consent with the discharge plan as it was reviewed with him. All questions were fielded by provider and/or clinic staff prior to patient discharge.    Final Clinical Impressions(s) / Urgent Care Diagnoses:   Final diagnoses:  Acute gastritis without hemorrhage, unspecified gastritis type    New Prescriptions:   Meds ordered this encounter  Medications  . sucralfate (CARAFATE) 1 g tablet    Sig: Take 1 tablet (1 g total) by mouth 4 (four) times daily -  with meals and at bedtime.    Dispense:  60 tablet    Refill:  0    Controlled Substance Prescriptions:   [place] Controlled Substance Registry consulted? Not Applicable  NOTE: This note was prepared using Dragon dictation software along with smaller phrase technology. Despite my best ability to proofread, there is the potential that transcriptional errors may still occur from this process, and are completely unintentional.     Karen Kitchens, NP 09/20/18 1350

## 2018-09-19 NOTE — Discharge Instructions (Addendum)
It was very nice seeing you today in clinic. Thank you for entrusting me with your care.   Please utilize the medications that we discussed. Your prescriptions have been called in to your pharmacy.   Make arrangements to follow up with your PCP in 1 week for re-evaluation. If this reoccurs after treatment, you may discuss seeing GI for further assessment.  If your symptoms/condition worsens, please seek follow up care either here or in the ER. Please remember, our [place] providers are "right here with you" when you need [place].   Again, it was my pleasure to take care of you today. Thank you for choosing our clinic. I hope that you start to feel better quickly.   Honor Loh, MSN, APRN, FNP-C, CEN Advanced Practice Provider [facility]

## 2018-11-02 ENCOUNTER — Other Ambulatory Visit: Payer: Self-pay

## 2018-11-02 ENCOUNTER — Encounter: Payer: Self-pay | Admitting: Emergency Medicine

## 2018-11-02 ENCOUNTER — Ambulatory Visit
Admission: EM | Admit: 2018-11-02 | Discharge: 2018-11-02 | Disposition: A | Discharge status: Home / Self Care | Payer: Self-pay | Attending: Family Medicine | Admitting: Family Medicine

## 2018-11-02 DIAGNOSIS — S00412A Abrasion of left ear, initial encounter: Secondary | ICD-10-CM

## 2018-11-02 DIAGNOSIS — H6123 Impacted cerumen, bilateral: Secondary | ICD-10-CM

## 2018-11-02 MED ORDER — NEOMYCIN-POLYMYXIN-HC 3.5-10000-1 OT SUSP: 4.0000 [drp] | Freq: Three times a day (TID) | OTIC | 0 refills | Status: AC

## 2018-11-02 NOTE — Discharge Instructions (Addendum)
Use medication as prescribed. Keep ears dry.  Follow up with your primary care physician this week as needed. Return to Urgent care for new or worsening concerns.

## 2018-11-02 NOTE — ED Provider Notes (Signed)
[facility] ____________________________________________  Time seen: Approximately 1:33 PM  I have reviewed the triage vital signs and the nursing notes.   HISTORY  Chief Complaint Otalgia  HPI Tanner French is a 28 y.o. male presenting for evaluation of bilateral ear pressure and muffled hearing.  Patient reports he has been trying over-the-counter Debrox kits and using Q-tips a lot trying to remove any wax and to clear the sensation without success.  States mild discomfort currently.  States hearing is somewhat muffled.  Denies fluid sensation.  Has been swimming a lot.  No injury or trauma.  Denies history of the same.  Denies recent cough, congestion, fever, sore throat, chest pain or shortness of breath.  Reports otherwise doing well.  Denies other relieving factors.  Rolm GalaGrandis, Heidi, MD: PCP    History reviewed. No pertinent past medical history.  There are no active problems to display for this patient.   Past Surgical History:  Procedure Laterality Date  . FRACTURE SURGERY Left 10/13   L Ulna  . FRACTURE SURGERY Left 11/15   L ulna     No current facility-administered medications for this encounter.   Current Outpatient Medications:  .  neomycin-polymyxin-hydrocortisone (CORTISPORIN) 3.5-10000-1 OTIC suspension, Place 4 drops into the left ear 3 (three) times daily for 7 days., Disp: 5 mL, Rfl: 0  Allergies Patient has no known allergies.  Family History  Problem Relation Age of Onset  . Healthy Mother   . Healthy Father     Social History Social History   Tobacco Use  . Smoking status: Former Games developermoker  . Smokeless tobacco: Never Used  Substance Use Topics  . Alcohol use: Yes  . Drug use: Yes    Types: Marijuana    Review of Systems Constitutional: No fever ENT: No sore throat.  Positive ear discomfort. Cardiovascular: Denies chest pain. Respiratory: Denies shortness of breath. Gastrointestinal: No abdominal pain.    Musculoskeletal: Negative for back pain. Skin: Negative for rash.   ____________________________________________   PHYSICAL EXAM:  VITAL SIGNS: ED Triage Vitals  Enc Vitals Group     BP 11/02/18 1310 127/83     Pulse Rate 11/02/18 1310 87     Resp 11/02/18 1310 16     Temp 11/02/18 1310 98.3 F (36.8 C)     Temp Source 11/02/18 1310 Oral     SpO2 11/02/18 1310 95 %     Weight 11/02/18 1307 145 lb (65.8 kg)     Height 11/02/18 1307 5\' 11"  (1.803 m)     Head Circumference --      Peak Flow --      Pain Score 11/02/18 1307 0     Pain Loc --      Pain Edu? --      Excl. in GC? --     Constitutional: Alert and oriented. Well appearing and in no acute distress. Eyes: Conjunctivae are normal. PERRL. EOMI. ENT      Head: Normocephalic      Ears:   Left: Nontender to auricle movement, mild erythema and irritation to ear canal, total cerumen impaction.Post cerumen removal, canal clear, canal erythematous with mild edema and excoriation, no TM erythema, normal TM.  Right: Nontender to auricle movement, canal normal appearing except for total cerumen impaction.  Po cerumen removal, canal clear, no erythema, normal TM.      Nose: No congestion Cardiovascular: Normal rate, regular rhythm. Grossly normal heart sounds.  Good peripheral circulation. Respiratory: Normal respiratory effort without  tachypnea nor retractions. Breath sounds are clear and equal bilaterally. No wheezes, rales, rhonchi. Musculoskeletal:  Steady gait. Neurologic:  Normal speech and language. Speech is normal. No gait instability.  Skin:  Skin is warm, dry and intact. No rash noted. Psychiatric: Mood and affect are normal. Speech and behavior are normal. Patient exhibits appropriate insight and judgment   ___________________________________________   LABS (all labs ordered are listed, but only abnormal results are displayed)  Labs Reviewed - No data to display   PROCEDURES Procedures   Cerumen  impaction/ear irrigation Bilateral total cerumen impaction.  Ear irrigation by CMA.  Ears clear post removal.  Patient tolerated well.  INITIAL IMPRESSION / ASSESSMENT AND PLAN / ED COURSE  Pertinent labs & imaging results that were available during my care of the patient were reviewed by me and considered in my medical decision making (see chart for details).  Well-appearing patient.  No acute distress.  Bilateral ears total cerumen impaction.  Left canal excoriated and erythematous, will treat with Cortisporin.  Supportive care.Discussed indication, risks and benefits of medications with patient.  Discussed follow up with Primary care physician this week. Discussed follow up and return parameters including no resolution or any worsening concerns. Patient verbalized understanding and agreed to plan.   ____________________________________________   FINAL CLINICAL IMPRESSION(S) / ED DIAGNOSES  Final diagnoses:  Bilateral impacted cerumen  Excoriation of left ear canal, initial encounter     ED Discharge Orders         Ordered    neomycin-polymyxin-hydrocortisone (CORTISPORIN) 3.5-10000-1 OTIC suspension  3 times daily     11/02/18 1357           Note: This dictation was prepared with Dragon dictation along with smaller phrase technology. Any transcriptional errors that result from this process are unintentional.         Marylene Land, NP 11/02/18 1401

## 2018-11-02 NOTE — ED Triage Notes (Signed)
Patient c/o pain in both ears for the past 5 days.  Patient states that he has been swimming a lot. Patient denies fevers.

## 2019-07-15 ENCOUNTER — Ambulatory Visit: Payer: Self-pay | Attending: Internal Medicine

## 2019-07-15 DIAGNOSIS — Z23 Encounter for immunization: Secondary | ICD-10-CM

## 2019-07-15 NOTE — Progress Notes (Signed)
   Covid-19 Vaccination Clinic  Name:  Tanner French    MRN: [id number] DOB: January 08, 1991  07/15/2019  [name] was observed post Covid-19 immunization for 15 minutes without incident. He was provided with Vaccine Information Sheet and instruction to access the V-Safe system.   [name] was instructed to call 911 with any severe reactions post vaccine: Marland Kitchen Difficulty breathing  . Swelling of face and throat  . A fast heartbeat  . A bad rash all over body  . Dizziness and weakness   Immunizations Administered    Name Date Dose VIS Date Route   Pfizer COVID-19 Vaccine 07/15/2019 12:08 PM 0.3 mL 03/22/2019 Intramuscular   Manufacturer: [employer], [employer]   Lot: [id number]   NDC: [address]-8

## 2019-08-06 ENCOUNTER — Ambulatory Visit: Payer: Self-pay | Attending: Internal Medicine

## 2019-08-06 DIAGNOSIS — Z23 Encounter for immunization: Secondary | ICD-10-CM

## 2019-08-06 NOTE — Progress Notes (Signed)
   Covid-19 Vaccination Clinic  Name:  RYANE CANAVAN    MRN: [id number] DOB: 1990-12-07  08/06/2019  Mr. Devita was observed post Covid-19 immunization for 15 minutes without incident. He was provided with Vaccine Information Sheet and instruction to access the V-Safe system.   Mr. Rondon was instructed to call 911 with any severe reactions post vaccine: Marland Kitchen Difficulty breathing  . Swelling of face and throat  . A fast heartbeat  . A bad rash all over body  . Dizziness and weakness   Immunizations Administered    Name Date Dose VIS Date Route   Pfizer COVID-19 Vaccine 08/06/2019  2:08 PM 0.3 mL 06/05/2018 Intramuscular   Manufacturer: [employer], [employer]   Lot: [id number]   NDC: [address]-5
